# Patient Record
Sex: Male | Born: 2000 | Race: Black or African American | Hispanic: No | Marital: Single | Smoking: Never smoker
Health system: Southern US, Community
[De-identification: ages and names within clinical notes are randomized; demographics above are authoritative.]

## PROBLEM LIST (undated history)

## (undated) DIAGNOSIS — S060X9A Concussion with loss of consciousness of unspecified duration, initial encounter: Secondary | ICD-10-CM

## (undated) HISTORY — DX: Concussion with loss of consciousness of unspecified duration, initial encounter: S06.0X9A

## (undated) HISTORY — PX: NO PAST SURGERIES: SHX2092

---
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
## 2020-05-23 ENCOUNTER — Emergency Department (INDEPENDENT_AMBULATORY_CARE_PROVIDER_SITE_OTHER): Payer: Managed Care, Other (non HMO)

## 2020-05-23 ENCOUNTER — Encounter: Payer: Self-pay | Admitting: Emergency Medicine

## 2020-05-23 ENCOUNTER — Other Ambulatory Visit: Payer: Self-pay

## 2020-05-23 ENCOUNTER — Emergency Department (INDEPENDENT_AMBULATORY_CARE_PROVIDER_SITE_OTHER)
Admission: EM | Admit: 2020-05-23 | Discharge: 2020-05-23 | Disposition: A | Discharge status: Home / Self Care | Payer: Managed Care, Other (non HMO) | Source: Home / Self Care | Attending: Family Medicine | Admitting: Family Medicine

## 2020-05-23 DIAGNOSIS — J069 Acute upper respiratory infection, unspecified: Secondary | ICD-10-CM | POA: Diagnosis not present

## 2020-05-23 DIAGNOSIS — R0602 Shortness of breath: Secondary | ICD-10-CM | POA: Diagnosis not present

## 2020-05-23 DIAGNOSIS — Z20822 Contact with and (suspected) exposure to covid-19: Secondary | ICD-10-CM

## 2020-05-23 DIAGNOSIS — R05 Cough: Secondary | ICD-10-CM | POA: Diagnosis not present

## 2020-05-23 NOTE — ED Provider Notes (Signed)
Michael Young CARE    CSN: [id number] Arrival date & time: 05/23/20  1437      History   Chief Complaint Chief Complaint  Patient presents with  . Fatigue  . Shortness of Breath  . Cough  . Nausea  . Diarrhea    HPI Michael Young is a 19 y.o. male.   Patient complains of onset of URI symptoms about 8 days ago including fatigue and chest congestion.  During the past 4 days he has developed increasing cough and chills with tightness in his anterior chest and sensation of shortness of breath.  He has developed nausea (without vomiting) and diarrhea during the past 3 days.  He denies changes in taste/smell.  He reports that he has never felt this sick before.  His 1 year-old brother has confirmed Michael Young.  The history is provided by the patient.    Past Medical History:  Diagnosis Date  . Concussion with loss of consciousness 10/22/2013    There are no problems to display for this patient.   History reviewed. No pertinent surgical history.     Home Medications    Prior to Admission medications   Not on File    Family History Family History  Problem Relation Age of Onset  . Sudden death Neg Hx   . Hypertension Neg Hx   . Hyperlipidemia Neg Hx   . Heart attack Neg Hx   . Diabetes Neg Hx     Social History Social History   Tobacco Use  . Smoking status: Never Smoker  . Smokeless tobacco: Never Used  Substance Use Topics  . Alcohol use: No    Alcohol/week: 0.0 standard drinks  . Drug use: No     Allergies   Patient has no known allergies.   Review of Systems Review of Systems No sore throat + cough No pleuritic pain, but feels tight in anterior chest No wheezing ? nasal congestion No post-nasal drainage No sinus pain/pressure No itchy/red eyes No earache No hemoptysis + SOB No fever, + chills/sweats + nausea No vomiting No abdominal pain  + diarrhea, resolved No urinary symptoms No skin rash + fatigue + myalgias +  headache Used OTC meds (Tylenol) without relief   Physical Exam Triage Vital Signs ED Triage Vitals  Enc Vitals Group     BP 05/23/20 1526 123/83     Pulse Rate 05/23/20 1526 93     Resp 05/23/20 1526 18     Temp 05/23/20 1526 98.8 F (37.1 C)     Temp Source 05/23/20 1526 Oral     SpO2 05/23/20 1526 100 %     Weight 05/23/20 1527 165 lb (74.8 kg)     Height 05/23/20 1527 5\' 10"  (1.778 m)     Head Circumference --      Peak Flow --      Pain Score 05/23/20 1526 0     Pain Loc --      Pain Edu? --      Excl. in [place]? --    No data found.  Updated Vital Signs BP 123/83 (BP Location: Right Arm)   Pulse 93   Temp 98.8 F (37.1 C) (Oral)   Resp 18   Ht 5\' 10"  (1.778 m)   Wt 74.8 kg   SpO2 100%   BMI 23.68 kg/m   Visual Acuity Right Eye Distance:   Left Eye Distance:   Bilateral Distance:    Right Eye Near:   Left Eye Near:  Bilateral Near:     Physical Exam Nursing notes and Vital Signs reviewed. Appearance:  Patient appears stated age, and in no acute distress Eyes:  Pupils are equal, round, and reactive to light and accomodation.  Extraocular movement is intact.  Conjunctivae are not inflamed  Ears:  Canals normal.  Tympanic membranes normal.  Nose:  Mildly congested turbinates.  No sinus tenderness.  Pharynx:  Normal Neck:  Supple.  Mildly enlarged lateral nodes are present, tender to palpation on the upper left.   Lungs:  Clear to auscultation.  Breath sounds are equal.  Moving air well. Heart:  Regular rate and rhythm without murmurs, rubs, or gallops.  Abdomen:  Nontender without masses or hepatosplenomegaly.  Bowel sounds are present.  No CVA or flank tenderness.  Extremities:  No edema.  Skin:  No rash present.   UC Treatments / Results  Labs (all labs ordered are listed, but only abnormal results are displayed) Labs Reviewed - No data to display  EKG   Radiology DG Chest 2 View  Result Date: 05/23/2020 CLINICAL DATA:  Fatigue.  Cough and  shortness of breath. EXAM: CHEST - 2 VIEW COMPARISON:  None. FINDINGS: The heart size and mediastinal contours are within normal limits. Both lungs are clear. The visualized skeletal structures are unremarkable. IMPRESSION: No active cardiopulmonary disease. Electronically Signed   By: Gerome Sam III M.D   On: 05/23/2020 15:55    Procedures Procedures (including critical care time)  Medications Ordered in UC Medications - No data to display  Initial Impression / Assessment and Plan / UC Course  I have reviewed the triage vital signs and the nursing notes.  Pertinent labs & imaging results that were available during my care of the patient were reviewed by me and considered in my medical decision making (see chart for details).    Benign exam.  Suspect COVID19; send-out test pending. Treat symptomatically for now.    Final Clinical Impressions(s) / UC Diagnoses   Final diagnoses:  Viral URI with cough  Exposure to COVID-19 virus     Discharge Instructions     Take plain guaifenesin (1200mg  extended release tabs such as Mucinex) twice daily, with plenty of water, for cough and congestion.  May add Pseudoephedrine (30mg , one or two every 4 to 6 hours) for sinus congestion.  Get adequate rest.   Try warm salt water gargles for sore throat.  Stop all antihistamines for now, and other non-prescription cough/cold preparations. May take Ibuprofen 200mg , 3 or 4 tabs every 8 hours with food for body aches, headache, etc. May take Delsym Cough Suppressant at bedtime for nighttime cough.   Isolate yourself until COVID-19 test result is available.  If your COVID19 test is positive, then you are infected with the novel coronavirus and could give the virus to others.  Please continue isolation at home for at least 10 days since the start of your symptoms.  Once you complete your 10 day quarantine, you may return to normal activities as long as you've not had a fever for over 24 hours (without taking fever reducing medicine) and your symptoms are improving. Please continue good preventive care measures, including:  frequent hand-washing, avoid touching your face, cover coughs/sneezes, stay out of crowds and keep a 6 foot distance from others.  Go to the nearest hospital emergency room if fever/cough/breathlessness are severe or illness seems like a threat to life.    ED Prescriptions    None        Lattie Haw, MD 05/24/20 1627

## 2020-05-23 NOTE — ED Triage Notes (Signed)
Patient reports onset of fatigue and congestion with developing cough 8 days ago; past 2 days has been nauseated and had some diarrhea (x 3 stools past 24 hours); able to eat solid food and drink fluids. Has not had covid vaccination. Brother was diagnosed with covid today.

## 2020-05-23 NOTE — Discharge Instructions (Addendum)
Take plain guaifenesin (1200mg  extended release tabs such as Mucinex) twice daily, with plenty of water, for cough and congestion.  May add Pseudoephedrine (30mg , one or two every 4 to 6 hours) for sinus congestion.  Get adequate rest.   Try warm salt water gargles for sore throat.  Stop all antihistamines for now, and other non-prescription cough/cold preparations. May take Ibuprofen 200mg , 3 or 4 tabs every 8 hours with food for body aches, headache, etc. May take Delsym Cough Suppressant at bedtime for nighttime cough.   Isolate yourself until COVID-19 test result is available.  If your COVID19 test is positive, then you are infected with the novel coronavirus and could give the virus to others.  Please continue isolation at home for at least 10 days since the start of your symptoms.  Once you complete your 10 day quarantine, you may return to normal activities as long as you've not had a fever for over 24 hours (without taking fever reducing medicine) and your symptoms are improving. Please continue good preventive care measures, including:  frequent hand-washing, avoid touching your face, cover coughs/sneezes, stay out of crowds and keep a 6 foot distance from others.  Go to the nearest hospital emergency room if fever/cough/breathlessness are severe or illness seems like a threat to life.

## 2020-05-25 ENCOUNTER — Telehealth: Payer: Self-pay | Admitting: Emergency Medicine

## 2020-05-25 NOTE — Telephone Encounter (Signed)
Call from mother(Jill) - no COVID results back at this time- 2 identifiers confirmed

## 2020-05-26 ENCOUNTER — Telehealth: Payer: Self-pay

## 2020-05-26 LAB — SARS-COV-2 RNA,(COVID-19) QUALITATIVE NAAT: SARS CoV2 RNA: DETECTED — AB

## 2020-05-26 NOTE — Telephone Encounter (Signed)
Positive COVID results provided to patient and patient's mother. COVID care discussed, verbalized understanding.

## 2020-11-21 ENCOUNTER — Other Ambulatory Visit: Payer: Self-pay

## 2020-11-21 ENCOUNTER — Encounter (HOSPITAL_BASED_OUTPATIENT_CLINIC_OR_DEPARTMENT_OTHER): Payer: Self-pay | Admitting: Orthopaedic Surgery

## 2020-11-22 ENCOUNTER — Other Ambulatory Visit (HOSPITAL_COMMUNITY): Payer: Managed Care, Other (non HMO)

## 2020-11-23 ENCOUNTER — Other Ambulatory Visit (HOSPITAL_COMMUNITY)
Admission: RE | Admit: 2020-11-23 | Discharge: 2020-11-23 | Disposition: A | Discharge status: Home / Self Care | Payer: Managed Care, Other (non HMO) | Source: Ambulatory Visit | Attending: Orthopaedic Surgery | Admitting: Orthopaedic Surgery

## 2020-11-23 DIAGNOSIS — Z01812 Encounter for preprocedural laboratory examination: Secondary | ICD-10-CM | POA: Insufficient documentation

## 2020-11-23 DIAGNOSIS — Z20822 Contact with and (suspected) exposure to covid-19: Secondary | ICD-10-CM | POA: Insufficient documentation

## 2020-11-23 LAB — SARS CORONAVIRUS 2 (TAT 6-24 HRS): SARS Coronavirus 2: NEGATIVE

## 2020-11-23 NOTE — Progress Notes (Signed)

## 2020-11-24 NOTE — H&P (Signed)
PREOPERATIVE H&P  Chief Complaint: LEFT ELBOW TRICEP TENDON RUPTURE  HPI: Michael Young is a 19 y.o. male who is scheduled for, Procedure(s): DISTAL TRICEPS TENDON REPAIR, POSSIBLE TENDON GRAFT.    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  He fell to the ground on an outstretched hand. Once tackled, he had immediate pain in the left distal elbow. He was seen in urgent care where x-rays were consistent with a displaced triceps tendon rupture with a piece of bone pulled off 3-4 centimeters. Family declined surgical interventions during football season.   His symptoms are rated as moderate to severe, and have been worsening.  This is significantly impairing activities of daily living.    Please see clinic note for further details on this patient's care.    He has elected for surgical management.   Past Medical History:  Diagnosis Date  . Concussion with loss of consciousness 10/22/2013   Past Surgical History:  Procedure Laterality Date  . NO PAST SURGERIES     Social History   Socioeconomic History  . Marital status: Single    Spouse name: Not on file  . Number of children: Not on file  . Years of education: Not on file  . Highest education level: Not on file  Occupational History  . Not on file  Tobacco Use  . Smoking status: Never Smoker  . Smokeless tobacco: Never Used  Vaping Use  . Vaping Use: Never used  Substance and Sexual Activity  . Alcohol use: No    Alcohol/week: 0.0 standard drinks  . Drug use: No  . Sexual activity: Never  Other Topics Concern  . Not on file  Social History Narrative  . Not on file   Social Determinants of Health   Financial Resource Strain:   . Difficulty of Paying Living Expenses: Not on file  Food Insecurity:   . Worried About Programme researcher, broadcasting/film/video in the Last Year: Not on file  . Ran Out of Food in the Last Year: Not on file    Transportation Needs:   . Lack of Transportation (Medical): Not on file  . Lack of Transportation (Non-Medical): Not on file  Physical Activity:   . Days of Exercise per Week: Not on file  . Minutes of Exercise per Session: Not on file  Stress:   . Feeling of Stress : Not on file  Social Connections:   . Frequency of Communication with Friends and Family: Not on file  . Frequency of Social Gatherings with Friends and Family: Not on file  . Attends Religious Services: Not on file  . Active Member of Clubs or Organizations: Not on file  . Attends Banker Meetings: Not on file  . Marital Status: Not on file   Family History  Problem Relation Age of Onset  . Sudden death Neg Hx   . Hypertension Neg Hx   . Hyperlipidemia Neg Hx   . Heart attack Neg Hx   . Diabetes Neg Hx    No Known Allergies Prior to Admission medications   Not on File    ROS: All other systems have been reviewed and were otherwise negative with the exception of those mentioned in the HPI and as above.  Physical Exam: General: Alert, no acute distress Cardiovascular: No pedal edema Respiratory: No cyanosis, no use of accessory musculature GI: No organomegaly, abdomen is soft and  non-tender Skin: No lesions in the area of chief complaint Neurologic: Sensation intact distally Psychiatric: Patient is competent for consent with normal mood and affect Lymphatic: No axillary or cervical lymphadenopathy  MUSCULOSKELETAL:  Left elbow: The patient had no active triceps extension.  He has obvious palpation of a defect of his distal triceps.  The triceps tendon appears to only be about an inch or two off the arm.  His distal motor and sensory function is otherwise intact.  He has passive range of motion of the elbow.    Imaging: MRI of his left elbow that has revealed a complete triceps tendon rupture, as well as at least a partial tear of the ulnar collateral ligament with bone contusions laterally,  consistent with a valgus stress.  Assessment: LEFT ELBOW TRICEP TENDON RUPTURE  Plan: Plan for Procedure(s): DISTAL TRICEPS TENDON REPAIR, POSSIBLE TENDON GRAFT  The patient has got a complex presentation with a delayed plan for surgery.  We talked about repair of his distal triceps and possible augmentation with an Achilles allograft.  The family understands that there is a higher risk for stiffness, heterotopic bone formation, and re-tear, all because of the delayed presentation as well as loss of function overall.    The risks benefits and alternatives were discussed with the patient including but not limited to the risks of nonoperative treatment, versus surgical intervention including infection, bleeding, nerve injury,  blood clots, cardiopulmonary complications, morbidity, mortality, among others, and they were willing to proceed.   The patient acknowledged the explanation, agreed to proceed with the plan and consent was signed.   Operative Plan: repair of distal triceps and possible augmentation with an Achilles allograft Discharge Medications: Standard DVT Prophylaxis: None Physical Therapy: Outpatient PT Special Discharge needs: Splint. Sling.    Vernetta Honey, PA-C  11/24/2020 1:45 PM

## 2020-11-25 ENCOUNTER — Ambulatory Visit (HOSPITAL_BASED_OUTPATIENT_CLINIC_OR_DEPARTMENT_OTHER): Payer: Managed Care, Other (non HMO) | Admitting: Anesthesiology

## 2020-11-25 ENCOUNTER — Other Ambulatory Visit: Payer: Self-pay

## 2020-11-25 ENCOUNTER — Encounter (HOSPITAL_BASED_OUTPATIENT_CLINIC_OR_DEPARTMENT_OTHER): Payer: Self-pay | Admitting: Orthopaedic Surgery

## 2020-11-25 ENCOUNTER — Encounter (HOSPITAL_BASED_OUTPATIENT_CLINIC_OR_DEPARTMENT_OTHER)
Admission: RE | Disposition: A | Discharge status: Home / Self Care | Payer: Self-pay | Source: Home / Self Care | Attending: Orthopaedic Surgery

## 2020-11-25 ENCOUNTER — Ambulatory Visit (HOSPITAL_BASED_OUTPATIENT_CLINIC_OR_DEPARTMENT_OTHER)
Admission: RE | Admit: 2020-11-25 | Discharge: 2020-11-25 | Disposition: A | Discharge status: Home / Self Care | Payer: Managed Care, Other (non HMO) | Attending: Orthopaedic Surgery | Admitting: Orthopaedic Surgery

## 2020-11-25 DIAGNOSIS — S46312A Strain of muscle, fascia and tendon of triceps, left arm, initial encounter: Secondary | ICD-10-CM | POA: Diagnosis not present

## 2020-11-25 DIAGNOSIS — W03XXXA Other fall on same level due to collision with another person, initial encounter: Secondary | ICD-10-CM | POA: Insufficient documentation

## 2020-11-25 DIAGNOSIS — S46319A Strain of muscle, fascia and tendon of triceps, unspecified arm, initial encounter: Secondary | ICD-10-CM | POA: Diagnosis present

## 2020-11-25 HISTORY — PX: DISTAL BICEPS TENDON REPAIR: SHX1461

## 2020-11-25 SURGERY — REPAIR, TENDON, BICEPS, DISTAL
Anesthesia: Regional | Site: Arm Upper | Laterality: Left

## 2020-11-25 MED ORDER — EPHEDRINE SULFATE 50 MG/ML IJ SOLN
INTRAMUSCULAR | Status: DC | PRN
  Administered 2020-11-25: 10 mg via INTRAVENOUS

## 2020-11-25 MED ORDER — MIDAZOLAM HCL 2 MG/2ML IJ SOLN
INTRAMUSCULAR | Status: AC
  Filled 2020-11-25: qty 2

## 2020-11-25 MED ORDER — ONDANSETRON HCL 4 MG PO TABS: 4.0000 mg | ORAL_TABLET | Freq: Three times a day (TID) | ORAL | 1 refills | Status: AC | PRN

## 2020-11-25 MED ORDER — OXYCODONE HCL 5 MG PO TABS: 5.0000 mg | ORAL_TABLET | Freq: Once | ORAL | Status: DC | PRN

## 2020-11-25 MED ORDER — FENTANYL CITRATE (PF) 100 MCG/2ML IJ SOLN: 25.0000 ug | INTRAMUSCULAR | Status: DC | PRN

## 2020-11-25 MED ORDER — PROMETHAZINE HCL 25 MG/ML IJ SOLN: 6.2500 mg | INTRAMUSCULAR | Status: DC | PRN

## 2020-11-25 MED ORDER — PROPOFOL 10 MG/ML IV BOLUS
INTRAVENOUS | Status: DC | PRN
  Administered 2020-11-25: 200 mg via INTRAVENOUS

## 2020-11-25 MED ORDER — VANCOMYCIN HCL 1000 MG IV SOLR
INTRAVENOUS | Status: DC | PRN
  Administered 2020-11-25: 1000 mg via TOPICAL

## 2020-11-25 MED ORDER — PHENYLEPHRINE HCL (PRESSORS) 10 MG/ML IV SOLN
INTRAVENOUS | Status: DC | PRN
  Administered 2020-11-25: 80 ug via INTRAVENOUS

## 2020-11-25 MED ORDER — FENTANYL CITRATE (PF) 100 MCG/2ML IJ SOLN
100.0000 ug | Freq: Once | INTRAMUSCULAR | Status: AC
  Administered 2020-11-25: 100 ug via INTRAVENOUS

## 2020-11-25 MED ORDER — CEFAZOLIN SODIUM-DEXTROSE 2-4 GM/100ML-% IV SOLN
INTRAVENOUS | Status: AC
  Filled 2020-11-25: qty 100

## 2020-11-25 MED ORDER — LIDOCAINE 2% (20 MG/ML) 5 ML SYRINGE
INTRAMUSCULAR | Status: DC | PRN
  Administered 2020-11-25: 30 mg via INTRAVENOUS

## 2020-11-25 MED ORDER — OXYCODONE HCL 5 MG PO TABS
ORAL_TABLET | ORAL | 0 refills | Status: AC
Start: 2020-11-25 — End: 2020-11-30

## 2020-11-25 MED ORDER — PROPOFOL 500 MG/50ML IV EMUL
INTRAVENOUS | Status: DC | PRN
  Administered 2020-11-25: 25 ug/kg/min via INTRAVENOUS

## 2020-11-25 MED ORDER — CEFAZOLIN SODIUM-DEXTROSE 2-4 GM/100ML-% IV SOLN
2.0000 g | INTRAVENOUS | Status: AC
  Administered 2020-11-25: 2 g via INTRAVENOUS

## 2020-11-25 MED ORDER — LACTATED RINGERS IV SOLN: INTRAVENOUS | Status: DC

## 2020-11-25 MED ORDER — DEXAMETHASONE SODIUM PHOSPHATE 10 MG/ML IJ SOLN
INTRAMUSCULAR | Status: DC | PRN
  Administered 2020-11-25: 4 mg via INTRAVENOUS

## 2020-11-25 MED ORDER — IBUPROFEN 800 MG PO TABS: 800.0000 mg | ORAL_TABLET | Freq: Three times a day (TID) | ORAL | 0 refills | Status: AC

## 2020-11-25 MED ORDER — ACETAMINOPHEN 500 MG PO TABS: 1000.0000 mg | ORAL_TABLET | Freq: Three times a day (TID) | ORAL | 0 refills | Status: AC

## 2020-11-25 MED ORDER — ONDANSETRON HCL 4 MG/2ML IJ SOLN
INTRAMUSCULAR | Status: DC | PRN
  Administered 2020-11-25: 4 mg via INTRAVENOUS

## 2020-11-25 MED ORDER — MIDAZOLAM HCL 2 MG/2ML IJ SOLN
2.0000 mg | Freq: Once | INTRAMUSCULAR | Status: AC
  Administered 2020-11-25: 2 mg via INTRAVENOUS

## 2020-11-25 MED ORDER — ROPIVACAINE HCL 5 MG/ML IJ SOLN
INTRAMUSCULAR | Status: DC | PRN
  Administered 2020-11-25: 30 mL via PERINEURAL

## 2020-11-25 MED ORDER — FENTANYL CITRATE (PF) 100 MCG/2ML IJ SOLN
INTRAMUSCULAR | Status: AC
  Filled 2020-11-25: qty 2

## 2020-11-25 MED ORDER — OXYCODONE HCL 5 MG/5ML PO SOLN: 5.0000 mg | Freq: Once | ORAL | Status: DC | PRN

## 2020-11-25 SURGICAL SUPPLY — 86 items
APL PRP STRL LF DISP 70% ISPRP (MISCELLANEOUS) ×1
APL SKNCLS STERI-STRIP NONHPOA (GAUZE/BANDAGES/DRESSINGS) ×1
BENZOIN TINCTURE PRP APPL 2/3 (GAUZE/BANDAGES/DRESSINGS) ×3 IMPLANT
BLADE HEX COATED 2.75 (ELECTRODE) IMPLANT
BLADE SURG 10 STRL SS (BLADE) ×3 IMPLANT
BLADE SURG 15 STRL LF DISP TIS (BLADE) ×1 IMPLANT
BLADE SURG 15 STRL SS (BLADE) ×3
BNDG CMPR 9X4 STRL LF SNTH (GAUZE/BANDAGES/DRESSINGS) ×1
BNDG ELASTIC 4X5.8 VLCR STR LF (GAUZE/BANDAGES/DRESSINGS) ×6 IMPLANT
BNDG ESMARK 4X9 LF (GAUZE/BANDAGES/DRESSINGS) ×3 IMPLANT
CHLORAPREP W/TINT 26 (MISCELLANEOUS) ×3 IMPLANT
CLOSURE STERI-STRIP 1/2X4 (GAUZE/BANDAGES/DRESSINGS) ×1
CLSR STERI-STRIP ANTIMIC 1/2X4 (GAUZE/BANDAGES/DRESSINGS) ×2 IMPLANT
CORD BIPOLAR FORCEPS 12FT (ELECTRODE) ×3 IMPLANT
COVER WAND RF STERILE (DRAPES) IMPLANT
CUFF TOURN SGL QUICK 18X3 (MISCELLANEOUS) ×3 IMPLANT
CUFF TOURN SGL QUICK 24 (TOURNIQUET CUFF) ×3
CUFF TRNQT CYL 24X4X16.5-23 (TOURNIQUET CUFF) ×1 IMPLANT
DECANTER SPIKE VIAL GLASS SM (MISCELLANEOUS) ×3 IMPLANT
DRAPE EXTREMITY T 121X128X90 (DISPOSABLE) ×3 IMPLANT
DRAPE INCISE IOBAN 66X45 STRL (DRAPES) ×3 IMPLANT
DRAPE OEC MINIVIEW 54X84 (DRAPES) ×3 IMPLANT
DRAPE U-SHAPE 47X51 STRL (DRAPES) ×3 IMPLANT
ELECT REM PT RETURN 9FT ADLT (ELECTROSURGICAL) ×3
ELECTRODE REM PT RTRN 9FT ADLT (ELECTROSURGICAL) ×1 IMPLANT
EXT HOSE W/PLC CONNECTION (MISCELLANEOUS) ×3
EXTENSION HOSE W/PLC CONNECTON (MISCELLANEOUS) ×1 IMPLANT
GAUZE SPONGE 4X4 12PLY STRL (GAUZE/BANDAGES/DRESSINGS) ×3 IMPLANT
GAUZE XEROFORM 1X8 LF (GAUZE/BANDAGES/DRESSINGS) ×3 IMPLANT
GLOVE BIO SURGEON STRL SZ 6.5 (GLOVE) ×2 IMPLANT
GLOVE BIO SURGEON STRL SZ8 (GLOVE) ×3 IMPLANT
GLOVE BIO SURGEONS STRL SZ 6.5 (GLOVE) ×1
GLOVE BIOGEL PI IND STRL 6.5 (GLOVE) ×1 IMPLANT
GLOVE BIOGEL PI IND STRL 8 (GLOVE) ×1 IMPLANT
GLOVE BIOGEL PI INDICATOR 6.5 (GLOVE) ×2
GLOVE BIOGEL PI INDICATOR 8 (GLOVE) ×2
GLOVE ECLIPSE 8.0 STRL XLNG CF (GLOVE) ×6 IMPLANT
GOWN STRL REUS W/ TWL LRG LVL3 (GOWN DISPOSABLE) ×2 IMPLANT
GOWN STRL REUS W/TWL LRG LVL3 (GOWN DISPOSABLE) ×6
GOWN STRL REUS W/TWL XL LVL3 (GOWN DISPOSABLE) ×3 IMPLANT
IMP SYS 2ND FIX PEEK 4.75X19.1 (Miscellaneous) ×3 IMPLANT
IMPL SYS 2ND FX PEEK 4.75X19.1 (Miscellaneous) ×1 IMPLANT
NEEDLE KEITH (NEEDLE) ×3 IMPLANT
NEEDLE TAPERED W/ NITINOL LOOP (MISCELLANEOUS) ×3 IMPLANT
NS IRRIG 1000ML POUR BTL (IV SOLUTION) ×3 IMPLANT
PACK ARTHROSCOPY DSU (CUSTOM PROCEDURE TRAY) ×3 IMPLANT
PACK BASIN DAY SURGERY FS (CUSTOM PROCEDURE TRAY) ×3 IMPLANT
PAD CAST 4YDX4 CTTN HI CHSV (CAST SUPPLIES) ×1 IMPLANT
PAD CAST CTTN 4X4 STRL (SOFTGOODS) ×3 IMPLANT
PAD COLD SHLDR WRAP-ON (PAD) IMPLANT
PADDING CAST COTTON 4X4 STRL (CAST SUPPLIES) ×3
PADDING CAST COTTON 4X4 STRL (SOFTGOODS) ×9
PENCIL SMOKE EVACUATOR (MISCELLANEOUS) ×3 IMPLANT
RETRIEVER SUT HEWSON (MISCELLANEOUS) ×3 IMPLANT
SHEET MEDIUM DRAPE 40X70 STRL (DRAPES) ×3 IMPLANT
SLEEVE SCD COMPRESS KNEE MED (MISCELLANEOUS) ×3 IMPLANT
SLING ARM FOAM STRAP LRG (SOFTGOODS) IMPLANT
SPLINT FAST PLASTER 5X30 (CAST SUPPLIES) ×20
SPLINT PLASTER CAST FAST 5X30 (CAST SUPPLIES) ×10 IMPLANT
SPONGE LAP 18X18 RF (DISPOSABLE) ×3 IMPLANT
STAPLER VISISTAT 35W (STAPLE) IMPLANT
SUCTION FRAZIER HANDLE 10FR (MISCELLANEOUS) ×2
SUCTION TUBE FRAZIER 10FR DISP (MISCELLANEOUS) ×1 IMPLANT
SUT ETHILON 2 0 FS 18 (SUTURE) ×9 IMPLANT
SUT FIBERWIRE #2 38 REV NDL BL (SUTURE) ×6
SUT FIBERWIRE #2 38 T-5 BLUE (SUTURE)
SUT FIBERWIRE #5 38 CONV NDL (SUTURE)
SUT MNCRL AB 4-0 PS2 18 (SUTURE) ×3 IMPLANT
SUT VIC AB 0 CT1 27 (SUTURE) ×9
SUT VIC AB 0 CT1 27XBRD ANBCTR (SUTURE) ×3 IMPLANT
SUT VIC AB 1 CT1 27 (SUTURE)
SUT VIC AB 1 CT1 27XBRD ANBCTR (SUTURE) IMPLANT
SUT VIC AB 2-0 CT1 27 (SUTURE)
SUT VIC AB 2-0 CT1 TAPERPNT 27 (SUTURE) IMPLANT
SUT VIC AB 2-0 SH 27 (SUTURE) ×3
SUT VIC AB 2-0 SH 27XBRD (SUTURE) ×1 IMPLANT
SUT VIC AB 3-0 SH 27 (SUTURE) ×3
SUT VIC AB 3-0 SH 27X BRD (SUTURE) ×1 IMPLANT
SUTURE FIBERWR #2 38 T-5 BLUE (SUTURE) IMPLANT
SUTURE FIBERWR #5 38 CONV NDL (SUTURE) IMPLANT
SUTURE FIBERWR#2 38 REV NDL BL (SUTURE) ×2 IMPLANT
SUTURE TAPE TIGERLINK 1.3MM BL (SUTURE) ×2 IMPLANT
SUTURETAPE TIGERLINK 1.3MM BL (SUTURE) ×6
SYR BULB EAR ULCER 3OZ GRN STR (SYRINGE) ×3 IMPLANT
TOWEL GREEN STERILE FF (TOWEL DISPOSABLE) ×3 IMPLANT
YANKAUER SUCT BULB TIP NO VENT (SUCTIONS) ×3 IMPLANT

## 2020-11-25 NOTE — Interval H&P Note (Signed)
History and Physical Interval Note:  11/25/2020 2:07 PM  Michael Young  has presented today for surgery, with the diagnosis of LEFT ELBOW TRICEP TENDON RUPTURE.  The various methods of treatment have been discussed with the patient and family. After consideration of risks, benefits and other options for treatment, the patient has consented to  Procedure(s): DISTAL TRICEPS TENDON REPAIR, POSSIBLE TENDON GRAFT (Left) as a surgical intervention.  The patient's history has been reviewed, patient examined, no change in status, stable for surgery.  I have reviewed the patient's chart and labs.  Questions were answered to the patient's satisfaction.     Bjorn Pippin

## 2020-11-25 NOTE — Anesthesia Procedure Notes (Signed)
Anesthesia Regional Block: Supraclavicular block   Pre-Anesthetic Checklist: ,, timeout performed, Correct Patient, Correct Site, Correct Laterality, Correct Procedure, Correct Position, site marked, Risks and benefits discussed,  Surgical consent,  Pre-op evaluation,  At surgeon's request and post-op pain management  Laterality: Left  Prep: chloraprep       Needles:  Injection technique: Single-shot  Needle Type: Echogenic Stimulator Needle     Needle Length: 10cm  Needle Gauge: 20     Additional Needles:   Procedures:,,,, ultrasound used (permanent image in chart),,,,  Narrative:  Start time: 11/25/2020 1:30 PM End time: 11/25/2020 1:35 PM Injection made incrementally with aspirations every 5 mL.  Performed by: Personally  Anesthesiologist: Mellody Dance, MD  Additional Notes: Functioning IV was confirmed and monitors applied. . Sterile prep and drape,hand hygiene and sterile gloves were used.Ultrasound guidance: relevant anatomy identified, needle position confirmed, local anesthetic spread visualized around nerve(s)., vascular puncture avoided.  Image printed for medical record.  Negative aspiration and negative test dose prior to incremental administration of local anesthetic. The patient tolerated the procedure well.

## 2020-11-25 NOTE — Anesthesia Postprocedure Evaluation (Signed)
Anesthesia Post Note  Patient: Michael Young  Procedure(s) Performed: DISTAL TRICEPS TENDON REPAIR (Left Arm Upper)     Patient location during evaluation: PACU Anesthesia Type: Regional Level of consciousness: sedated Pain management: pain level controlled Vital Signs Assessment: post-procedure vital signs reviewed and stable Respiratory status: spontaneous breathing and respiratory function stable Cardiovascular status: stable Postop Assessment: no apparent nausea or vomiting Anesthetic complications: no   No complications documented.  Last Vitals:  Vitals:   11/25/20 1645 11/25/20 1656  BP: 128/73   Pulse: 92 97  Resp: (!) 23 (!) 21  Temp:    SpO2: 100% 99%    Last Pain:  Vitals:   11/25/20 1645  TempSrc:   PainSc: Asleep                 Mellody Dance

## 2020-11-25 NOTE — Discharge Instructions (Signed)
Ramond Marrow MD, MPH  Alfonse Alpers, PA-C  [facility]  [address], [address]  [phone] (tel)   [phone] (fax)    POST-OPERATIVE INSTRUCTIONS    WOUND CARE  - Please keep splint clean dry and intact until followup.  - You may shower on Post-Op Day #3.  - You must keep splint DRY during this process and may find that a plastic bag taped around the extremity or alternatively a towel based bath may be a better option.   - If you get your splint wet or if it is damaged please contact our clinic.  - DO NOT REMOVE YOUR SPLINT!  EXERCISES  - Due to your splint being in place you will not be able to bear weight through your extremity.   - YOUR ARM MUST STAY STRAIGHT AT ALL TIMES - DO NOT REMOVE YOUR SPLINT! - Please continue to work on range of motion of your fingers and stretch these multiple times a day to prevent stiffness.  - Please continue to ambulate and do not stay sitting or lying for too long. Perform foot and wrist pumps to assist in circulation.   FOLLOW-UP  If you develop a Fever (>101.5), Redness or Drainage from the surgical incision site, please call our office to arrange for an evaluation.  Please call the office to schedule a follow-up appointment for your incision check if you do not already have one, 7-10 days post-operatively.   REGIONAL ANESTHESIA (NERVE BLOCKS)  The anesthesia team may have performed a nerve block for you if safe in the setting of your care. This is a great tool used to minimize pain. Typically the block may start wearing off overnight but the long acting medicine may last for 3-4 days. The nerve block wearing off can be a challenging period but please utilize your as needed pain medications to try and manage this period.    POST-OP MEDICATIONS- Multimodal approach to pain control   In general your pain will be controlled with a combination of substances. Prescriptions unless otherwise discussed are  electronically sent to your pharmacy. This is a carefully made plan we use to minimize narcotic use.    - Ibuprofen - Anti-inflammatory medication taken on a scheduled basis  - Acetaminophen - Non-narcotic pain medicine taken on a scheduled basis  - Oxycodone - This is a strong narcotic, to be used only on an "as needed" basis for pain.  - Zofran - take as needed for nausea   HELPFUL INFORMATION    If you had a block, it will wear off between 8-24 hrs postop typically. This is period when your pain may go from nearly zero to the pain you would have had postop without the block. This is an abrupt transition but nothing dangerous is happening. You may take an extra dose of narcotic when this happens.    You may be more comfortable sleeping in a semi-seated position the first few nights following surgery. Keep a pillow propped under the elbow and forearm for comfort. If you have a recliner type of chair it might be beneficial. If not that is fine too, but it would be helpful to sleep propped up with pillows behind your operated shoulder as well under your elbow and forearm. This will reduce pulling on the suture lines.    When dressing, put your operative arm in the sleeve first. When getting undressed, take your operative arm out last. Loose fitting, button-down shirts are recommended. Often in  the first days after surgery you may be more comfortable keeping your operative arm under your shirt and not through the sleeve.    You may return to work/school in the next couple of days when you feel up to it. Desk work and typing in the splint is fine.    We suggest you use the pain medication the first night prior to going to bed, in order to ease any pain when the anesthesia wears off. You should avoid taking pain medications on an empty stomach as it will make you nauseous.    You should wean off your narcotic medicines as soon as you are able. Most patients will be off or using  minimal narcotics before their first postop appointment.    Do not drink alcoholic beverages or take illicit drugs when taking pain medications.    It is against the law to drive while taking narcotics. In some states it is against the law to drive while your arm is in a splint.    Pain medication may make you constipated. Below are a few solutions to try in this order:  - Decrease the amount of pain medication if you aren't having pain.  - Drink lots of decaffeinated fluids.  - Drink prune juice and/or each dried prunes    If the first 3 don't work start with additional solutions  - Take Colace - an over-the-counter stool softener  - Take Senokot - an over-the-counter laxative  - Take Miralax - a stronger over-the-counter laxative  Post Anesthesia Home Care Instructions  Activity: Get plenty of rest for the remainder of the day. A responsible individual must stay with you for 24 hours following the procedure.  For the next 24 hours, DO NOT: -Drive a car -Advertising copywriter -Drink alcoholic beverages -Take any medication unless instructed by your physician -Make any legal decisions or sign important papers.  Meals: Start with liquid foods such as gelatin or soup. Progress to regular foods as tolerated. Avoid greasy, spicy, heavy foods. If nausea and/or vomiting occur, drink only clear liquids until the nausea and/or vomiting subsides. Call your physician if vomiting continues.  Special Instructions/Symptoms: Your throat may feel dry or sore from the anesthesia or the breathing tube placed in your throat during surgery. If this causes discomfort, gargle with warm salt water. The discomfort should disappear within 24 hours.  If you had a scopolamine patch placed behind your ear for the management of post- operative nausea and/or vomiting:  1. The medication in the patch is effective for 72 hours, after which it should be removed.  Wrap patch in a tissue and discard in the trash.  Wash hands thoroughly with soap and water. 2. You may remove the patch earlier than 72 hours if you experience unpleasant side effects which may include dry mouth, dizziness or visual disturbances. 3. Avoid touching the patch. Wash your hands with soap and water after contact with the patch.    Regional Anesthesia Blocks  1. Numbness or the inability to move the "blocked" extremity may last from 3-48 hours after placement. The length of time depends on the medication injected and your individual response to the medication. If the numbness is not going away after 48 hours, call your surgeon.  2. The extremity that is blocked will need to be protected until the numbness is gone and the  Strength has returned. Because you cannot feel it, you will need to take extra care to avoid injury. Because it may be weak,  you may have difficulty moving it or using it. You may not know what position it is in without looking at it while the block is in effect.  3. For blocks in the legs and feet, returning to weight bearing and walking needs to be done carefully. You will need to wait until the numbness is entirely gone and the strength has returned. You should be able to move your leg and foot normally before you try and bear weight or walk. You will need someone to be with you when you first try to ensure you do not fall and possibly risk injury.  4. Bruising and tenderness at the needle site are common side effects and will resolve in a few days.  5. Persistent numbness or new problems with movement should be communicated to the surgeon or the [facility] [phone] [facility] [phone]).

## 2020-11-25 NOTE — Transfer of Care (Signed)
Immediate Anesthesia Transfer of Care Note  Patient: Michael Young  Procedure(s) Performed: DISTAL TRICEPS TENDON REPAIR (Left Arm Upper)  Patient Location: PACU  Anesthesia Type:GA combined with regional for post-op pain  Level of Consciousness: drowsy and patient cooperative  Airway & Oxygen Therapy: Patient Spontanous Breathing and Patient connected to face mask oxygen  Post-op Assessment: Report given to RN and Post -op Vital signs reviewed and stable  Post vital signs: Reviewed and stable  Last Vitals:  Vitals Value Taken Time  BP 109/51 11/25/20 1620  Temp 36.6 C 11/25/20 1620  Pulse 78 11/25/20 1624  Resp 25 11/25/20 1624  SpO2 100 % 11/25/20 1624  Vitals shown include unvalidated device data.  Last Pain:  Vitals:   11/25/20 1210  TempSrc: Oral  PainSc: 0-No pain      Patients Stated Pain Goal: 6 (11/25/20 1210)  Complications: No complications documented.

## 2020-11-25 NOTE — Progress Notes (Signed)
Assisted Dr. Bass with left, ultrasound guided, supraclavicular block. Side rails up, monitors on throughout procedure. See vital signs in flow sheet. Tolerated Procedure well. 

## 2020-11-25 NOTE — Anesthesia Procedure Notes (Signed)
Procedure Name: LMA Insertion Date/Time: 11/25/2020 2:38 PM Performed by: Jorge Amparo, Jewel Baize, CRNA Pre-anesthesia Checklist: Patient identified, Emergency Drugs available, Suction available and Patient being monitored Patient Re-evaluated:Patient Re-evaluated prior to induction Oxygen Delivery Method: Circle System Utilized Preoxygenation: Pre-oxygenation with 100% oxygen Induction Type: IV induction Ventilation: Mask ventilation without difficulty LMA: LMA inserted LMA Size: 4.0 Number of attempts: 1 Airway Equipment and Method: bite block Placement Confirmation: positive ETCO2 Tube secured with: Tape Dental Injury: Teeth and Oropharynx as per pre-operative assessment

## 2020-11-25 NOTE — Anesthesia Preprocedure Evaluation (Addendum)
Anesthesia Evaluation  Patient identified by MRN, date of birth, ID band Patient awake    Reviewed: Allergy & Precautions, NPO status , Patient's Chart, lab work & pertinent test results  Airway Mallampati: II  TM Distance: >3 FB Neck ROM: Full    Dental no notable dental hx.    Pulmonary neg pulmonary ROS,    Pulmonary exam normal breath sounds clear to auscultation       Cardiovascular Exercise Tolerance: Good negative cardio ROS Normal cardiovascular exam Rhythm:Regular Rate:Normal     Neuro/Psych negative neurological ROS  negative psych ROS   GI/Hepatic negative GI ROS, Neg liver ROS,   Endo/Other  negative endocrine ROS  Renal/GU negative Renal ROS     Musculoskeletal negative musculoskeletal ROS (+)   Abdominal Normal abdominal exam  (+)   Peds negative pediatric ROS (+)  Hematology negative hematology ROS (+)   Anesthesia Other Findings Triceps tendon  Reproductive/Obstetrics negative OB ROS                             Anesthesia Physical Anesthesia Plan  ASA: I  Anesthesia Plan: General and Regional   Post-op Pain Management: GA combined w/ Regional for post-op pain   Induction: Intravenous  PONV Risk Score and Plan: 2  Airway Management Planned: LMA and Oral ETT  Additional Equipment:   Intra-op Plan:   Post-operative Plan: Extubation in OR  Informed Consent: I have reviewed the patients History and Physical, chart, labs and discussed the procedure including the risks, benefits and alternatives for the proposed anesthesia with the patient or authorized representative who has indicated his/her understanding and acceptance.     Dental advisory given  Plan Discussed with: CRNA  Anesthesia Plan Comments: (Supraclavicular block for postop pain control as requested by surgeon Everardo Pacific. GA/LMA vs. GETA depending on patient positioning. )       Anesthesia Quick  Evaluation

## 2020-11-26 NOTE — Op Note (Signed)
Orthopaedic Surgery Operative Note (CSN: [id number])  Michael Young  2001/12/01 Date of Surgery: 11/25/2020   Diagnoses:  LEFT ELBOW TRICEP TENDON RUPTURE, chronic  Procedure: Left distal triceps repair, chronic without graft Left ulnar nerve neurolysis   Operative Finding Successful completion of the planned procedure.  Patient was stable to about 30 degrees of flexion however at his young age we felt that an allograft was really not in his best interest.  There was still reasonable quality tendon that was retracted.  Is retracted about 8 cm from its native location and there was a coronal plane split in the tendon itself.  We asked the tendon together and had good apposition to bone but he has a high risk for stiffness.  The ulnar nerve was buried in scar tissue and carefully neurolysed to avoid damage and to avoid ulnar nerve paresthesias as his newly approximated triceps tendon may have pulled through the scar on the nerve itself.  We left it in its place.  This was extremely difficult case and we worry about the outcome.  We extensively documented and discussed with the family that we had recommended surgery months ago at the initial time of injury however the family and patient were adamant that they would not have surgery until after football season has been completed.  We made the patient obtain a third opinion as well however as an adult patient he still wanted to proceed AGAINST MEDICAL ADVICE.  I worry that he is at significant risk for rerupture and complication compared to his possibilities of these things early in the post injury phase.  That said the repair was robust and we are overall relatively happy with the construct.  Post-operative plan: The patient will be NWB in splint for a week in extension, next week locked straight in a brace in extension then can start distal triceps protocol.  The patient will be nonweightbearing and discharged as above..  DVT prophylaxis not indicated in  this ambulatory upper extremity patient without significant risk factors.   Pain control with PRN pain medication preferring oral medicines.  Follow up plan will be scheduled in approximately 7 days for incision check and XR.  Post-Op Diagnosis: Same Surgeons:Primary: Bjorn Pippin, MD Assistants:Caroline McBane PA-C Location: [facility] OR ROOM 8 Anesthesia: General with regional anesthesia Antibiotics: Ancef 2 g with local vancomycin powder 1 g at the surgical site Tourniquet time:  Total Tourniquet Time Documented: Upper Arm (Left) - 69 minutes Total: Upper Arm (Left) - 69 minutes  Estimated Blood Loss: Minimal Complications: None Specimens: None Implants: Implant Name Type Inv. Item Serial No. Manufacturer Lot No. LRB No. Used Action  IMP SYS 2ND FIX PEEK 4.75X19.1 - [id number] Miscellaneous IMP SYS 2ND FIX PEEK [id number]  [employer] [id number] Left 1 Implanted    Indications for Surgery:   Michael Young is a 19 y.o. male with distal triceps injury in August and a complex history that is well-documented in my findings above as well as in my medical documentation from the clinic.  Despite initially going AGAINST MEDICAL ADVICE and management he eventually requested surgical management with a triceps repair.  Benefits and risks of operative and nonoperative management were discussed prior to surgery with patient/guardian(s) and informed consent form was completed.  Specific risks including infection, need for additional surgery, rerupture, need for allograft, loss of function, stiffness, heterotopic bone amongst others   Procedure:   The patient was identified properly. Informed consent was obtained and the surgical site was marked. The  patient was taken up to suite where general anesthesia was induced.  The patient was positioned lateral on a beanbag with arm over a sure foot positioner.  The left elbow was prepped and draped in the usual sterile fashion.  Timeout was performed before the  beginning of the case.  Tourniquet was used for the above duration.  We began with a posterior longitudinal approach to the elbow.  Went to skin sharply achieving hemostasis we progressed.  We made full-thickness skin flaps medially and laterally and were careful to avoid deep dissection medially to avoid damage to the ulnar nerve.  Branch of the medial antebrachial cutaneous nerve were protected.  At this point we exposed the distal triceps rupture which about 8 cm retracted.  We turned our attention first to the ulnar nerve.  The ulnar nerve was found proximal to the zone of injury and it was noted to be buried in scar tissue.  As we manipulated the triceps the ulnar nerve was clearly being placed under tension.  Based on this we felt that ulnar nerve neurolysis was in the patient's best interest.  We started from proximal proceeded distal and were able to carefully and accurately released the ulnar nerve from it surrounding scar tissue.  We did not perform an ulnar nerve transposition as this did not feel indicated in this patient.  We cleared and release the nerve down to the typical portion past the flexor carpi ulnaris taking care to protect the branch to the flexor carpi ulnaris.  We did not sacrificed the branch to the joint.  At that point once the nerve was fully released we protected with blunt retractors the remainder of the case.  We went back and identified the distal triceps rupture.  We cleared the olecranon of all overlying soft tissue and prepared it for healing using a rondure as well as a curette.  Once this was performed we took stock and the tendon itself.  It was able to be mobilized after extensive dissection and release of scar.  There were 2 distinct limbs of the tendon with a coronal split in the tendon itself.  We were careful to pull both of these under similar tension and stitched these together before proceeding with the rest of the repair.  We used two #2 FiberWire  sutures in ran them up and down the tendon in a modified Krakw fashion.  We had good purchase of the tendon itself.  Once that was performed we used a speed bridge type technique and bone tunnels to perform a double row repair shuttling sutures performing a box and X configuration.  We had great apposition of the tendon to bone for healing.  The elbow was placed in full extension during this portion as the patient had relatively significant amounts of retraction.  At that point we were able to hold our repair together with a 4.75 mm swivel lock anchor, peek, that was placed after drilling and tapping in the proximal ulna.  We took care to avoid the joint for all of these tunnels.  Once this was performed we had a great stable repair to at least 30 degrees of flexion that we were careful to not take it passed out as were worried about stressing our construct.  We then irrigated copiously and placed interrupted figure-of-eight Vicryl sutures to reinforce the repair to the periosteum and any soft tissue around.  Were careful to protect the ulnar nerve noted it was intact at the end of the  case.  We irrigated the wound copiously before placing local antibiotic as listed above.  We closed the incision in a multilayer fashion with absorbable suture.  Sterile dressing was placed.  A splint was placed in near full extension.  Patient was awoken taken to PACU in stable condition.  Alfonse Alpers, PA-C, present and scrubbed throughout the case, critical for completion in a timely fashion, and for retraction, instrumentation, closure.

## 2020-11-28 ENCOUNTER — Encounter (HOSPITAL_BASED_OUTPATIENT_CLINIC_OR_DEPARTMENT_OTHER): Payer: Self-pay | Admitting: Orthopaedic Surgery

## 2021-02-18 IMAGING — DX DG CHEST 2V
2 series · 2 of 2 positions shown · non-contrast
Comparison: None.

CLINICAL DATA: Fatigue.  Cough and shortness of breath.

EXAM:
CHEST - 2 VIEW

[chest pa]
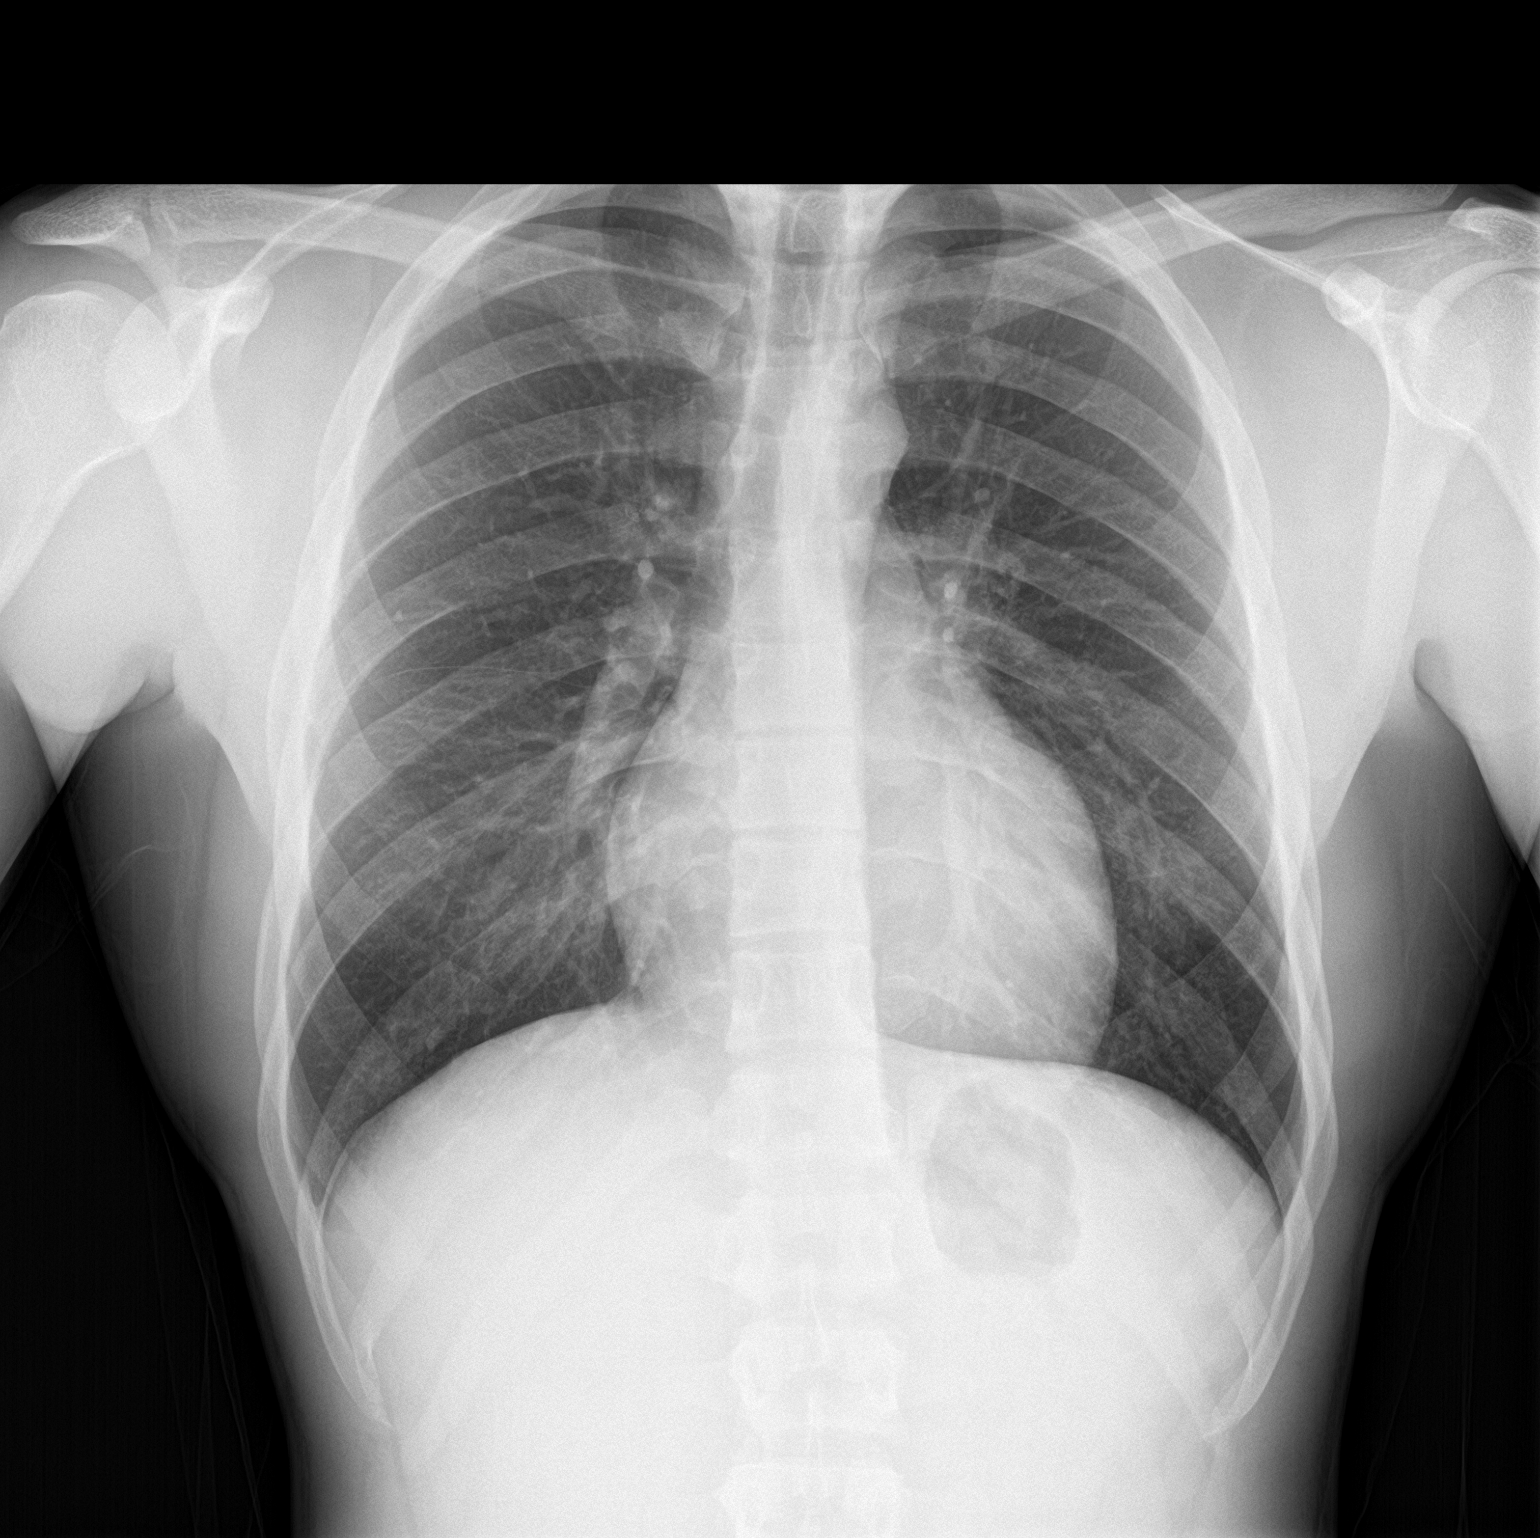

[chest lat]
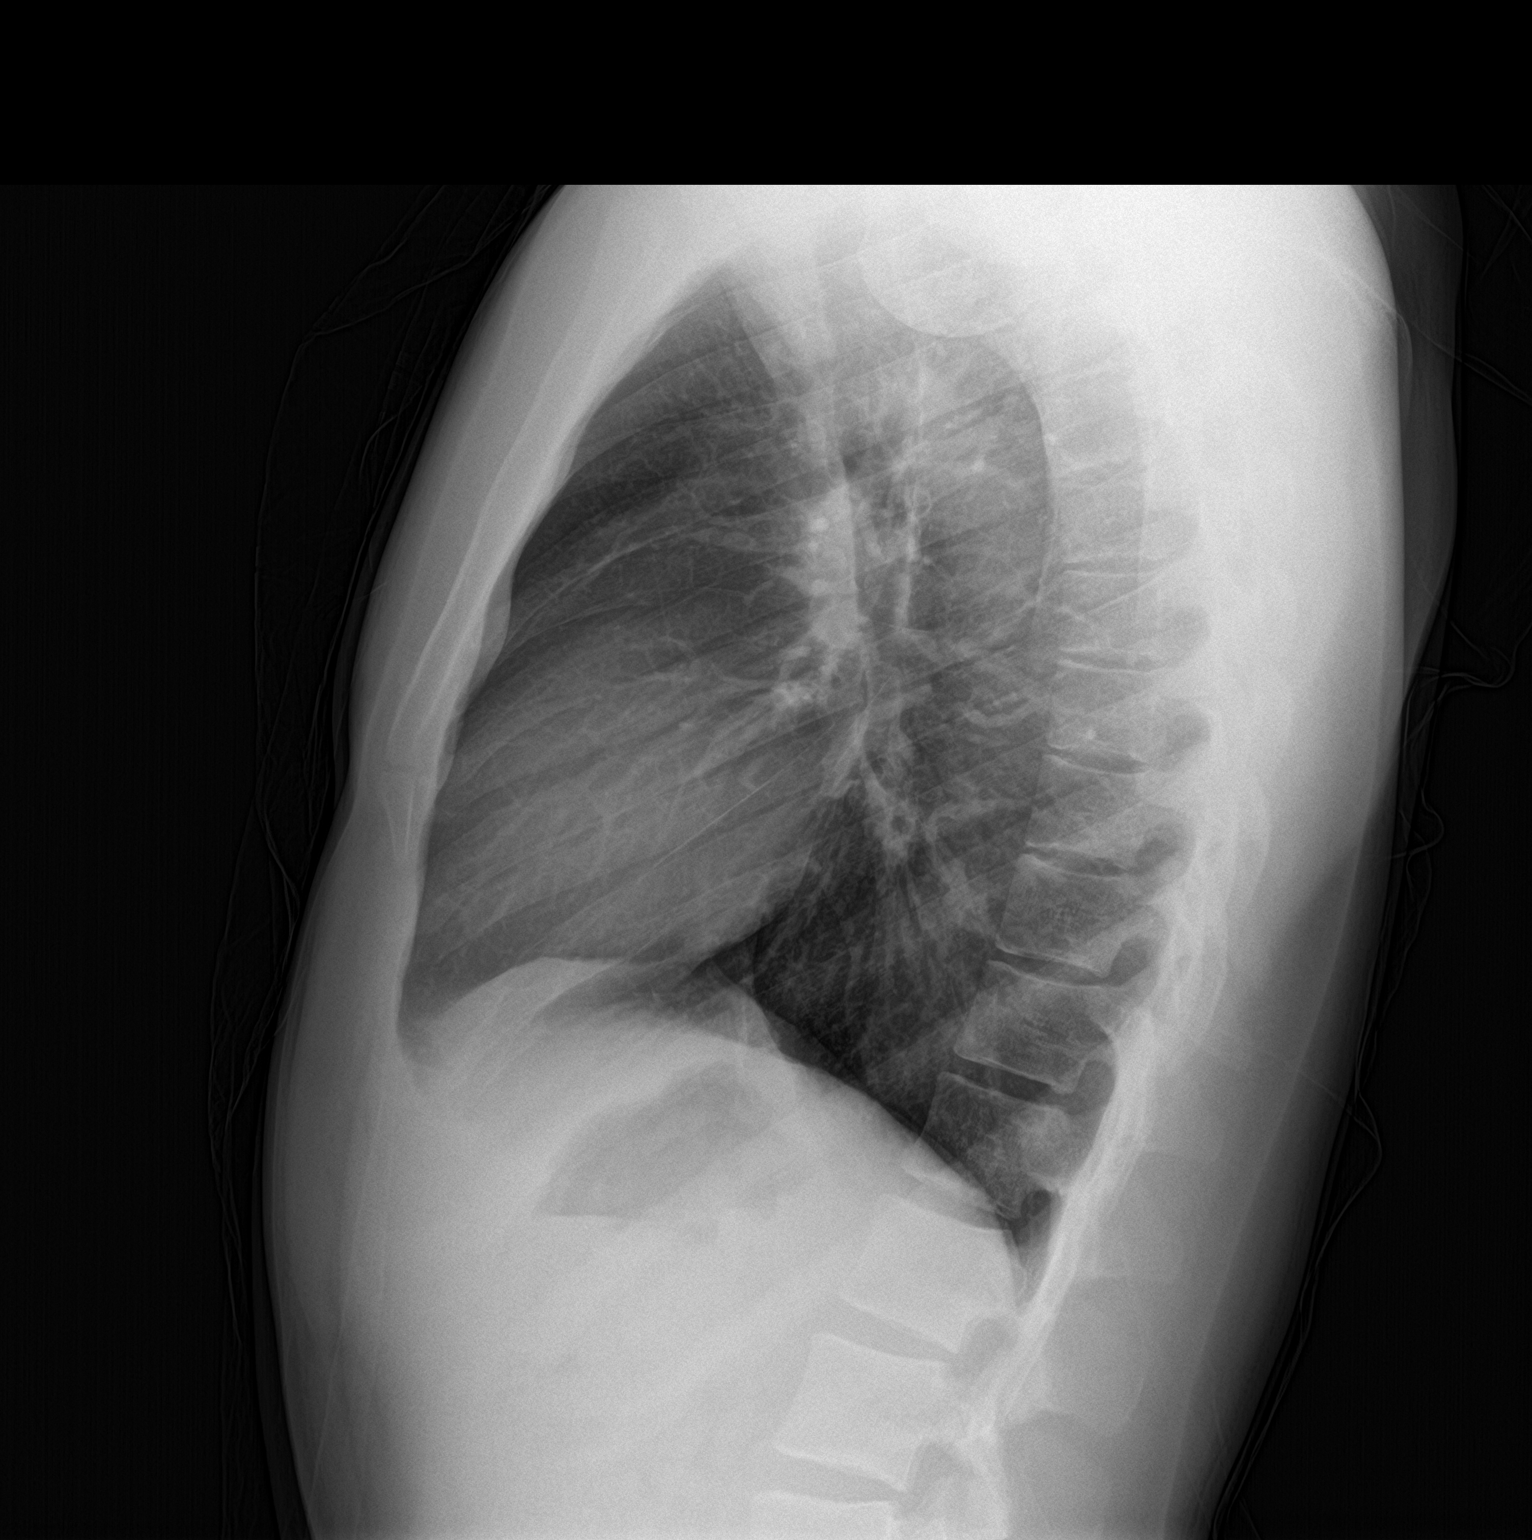

[2 of 2 positions shown; findings below may reference images not displayed]

FINDINGS: The heart size and mediastinal contours are within normal limits.
Both lungs are clear. The visualized skeletal structures are
unremarkable.
IMPRESSION: No active cardiopulmonary disease.

## 2022-07-10 ENCOUNTER — Emergency Department
Admission: EM | Admit: 2022-07-10 | Discharge: 2022-07-10 | Disposition: A | Discharge status: Home / Self Care | Payer: Managed Care, Other (non HMO)

## 2022-07-10 DIAGNOSIS — R1032 Left lower quadrant pain: Secondary | ICD-10-CM

## 2022-07-10 NOTE — Discharge Instructions (Signed)
Return if needed

## 2022-07-10 NOTE — ED Provider Notes (Signed)
[facility]    CSN: [id number] Arrival date & time: 07/10/22  1406      History   Chief Complaint Chief Complaint  Patient presents with   Groin Pain    Left side groin pain. X3 days    HPI Michael Young is a 21 y.o. male.   HPI  Healthy 21 year old.  On no medications.  He states he likes to workout in the gym quite frequently.  During one of his workouts recently when he was doing a lot of ab work he developed pain in his left lower abdomen and groin region.  It lasted for couple days.  He mentioned it at work.  They told him that he needs a note to come back to work to make sure that he does not have a hernia.  He states he has never had a hernia, did not have any bulging, and he feels certain that it was a muscle strain.  No urinary complaints.   Past Medical History:  Diagnosis Date   Concussion with loss of consciousness 10/22/2013    There are no problems to display for this patient.   Past Surgical History:  Procedure Laterality Date   DISTAL BICEPS TENDON REPAIR Left 11/25/2020   Procedure: DISTAL TRICEPS TENDON REPAIR;  Surgeon: Bjorn Pippin, MD;  Location: [facility];  Service: Orthopedics;  Laterality: Left;   NO PAST SURGERIES         Home Medications    Prior to Admission medications   Not on File    Family History Family History  Problem Relation Age of Onset   Sudden death Neg Hx    Hypertension Neg Hx    Hyperlipidemia Neg Hx    Heart attack Neg Hx    Diabetes Neg Hx     Social History Social History   Tobacco Use   Smoking status: Never   Smokeless tobacco: Never  Vaping Use   Vaping Use: Never used  Substance Use Topics   Alcohol use: No    Alcohol/week: 0.0 standard drinks of alcohol   Drug use: No     Allergies   Patient has no known allergies.   Review of Systems Review of Systems See HPI  Physical Exam Triage Vital Signs ED Triage Vitals  Enc Vitals Group     BP 07/10/22 1421 126/82      Pulse Rate 07/10/22 1421 94     Resp 07/10/22 1421 20     Temp 07/10/22 1421 98.7 F (37.1 C)     Temp Source 07/10/22 1421 Oral     SpO2 07/10/22 1421 96 %     Weight 07/10/22 1420 160 lb (72.6 kg)     Height 07/10/22 1420 5\' 10"  (1.778 m)     Head Circumference --      Peak Flow --      Pain Score 07/10/22 1420 0     Pain Loc --      Pain Edu? --      Excl. in GC? --    No data found.  Updated Vital Signs BP 126/82 (BP Location: Left Arm)   Pulse 94   Temp 98.7 F (37.1 C) (Oral)   Resp 20   Ht 5\' 10"  (1.778 m)   Wt 72.6 kg   SpO2 96%   BMI 22.96 kg/m      Physical Exam Constitutional:      General: He is not in acute distress.  Appearance: He is well-developed.  HENT:     Head: Normocephalic and atraumatic.  Eyes:     Conjunctiva/sclera: Conjunctivae normal.     Pupils: Pupils are equal, round, and reactive to light.  Cardiovascular:     Rate and Rhythm: Normal rate.  Pulmonary:     Effort: Pulmonary effort is normal. No respiratory distress.  Abdominal:     General: There is no distension.     Palpations: Abdomen is soft. There is no mass.     Tenderness: There is no abdominal tenderness.     Hernia: No hernia is present.  Musculoskeletal:        General: Normal range of motion.     Cervical back: Normal range of motion.  Skin:    General: Skin is warm and dry.  Neurological:     Mental Status: He is alert.      UC Treatments / Results  Labs (all labs ordered are listed, but only abnormal results are displayed) Labs Reviewed - No data to display  EKG   Radiology No results found.  Procedures Procedures (including critical care time)  Medications Ordered in UC Medications - No data to display  Initial Impression / Assessment and Plan / UC Course  I have reviewed the triage vital signs and the nursing notes.  Pertinent labs & imaging results that were available during my care of the patient were reviewed by me and considered in  my medical decision making (see chart for details).     Final Clinical Impressions(s) / UC Diagnoses   Final diagnoses:  Groin pain, left     Discharge Instructions      Return if needed   ED Prescriptions   None    PDMP not reviewed this encounter.   Eustace Moore, MD 07/10/22 1440

## 2022-07-10 NOTE — ED Triage Notes (Signed)
Pt states that he had some groin pain x3 days ago.  Pt states that the pain has since resolved. Pt states that he does work out a lot and this has happened before.   Pt states that he is required to have a note for work.

## 2022-12-03 ENCOUNTER — Encounter: Payer: Self-pay | Admitting: Emergency Medicine

## 2022-12-03 ENCOUNTER — Ambulatory Visit: Payer: Managed Care, Other (non HMO)

## 2022-12-03 ENCOUNTER — Other Ambulatory Visit: Payer: Self-pay

## 2022-12-03 ENCOUNTER — Ambulatory Visit
Admission: EM | Admit: 2022-12-03 | Discharge: 2022-12-03 | Disposition: A | Discharge status: Home / Self Care | Payer: Managed Care, Other (non HMO) | Attending: Family Medicine | Admitting: Family Medicine

## 2022-12-03 DIAGNOSIS — S59901A Unspecified injury of right elbow, initial encounter: Secondary | ICD-10-CM

## 2022-12-03 DIAGNOSIS — M25521 Pain in right elbow: Secondary | ICD-10-CM

## 2022-12-03 NOTE — ED Provider Notes (Signed)
[facility]    CSN: [id number] Arrival date & time: 12/03/22  1717      History   Chief Complaint Chief Complaint  Patient presents with   Elbow Injury    HPI Michael Young is a 21 y.o. male.   HPI 21 year old male presents with right elbow injury playing football throwing football felt like it locked up.  Patient reports elbow pain began on November 20 now getting worse.  Past Medical History:  Diagnosis Date   Concussion with loss of consciousness 10/22/2013    There are no problems to display for this patient.   Past Surgical History:  Procedure Laterality Date   DISTAL BICEPS TENDON REPAIR Left 11/25/2020   Procedure: DISTAL TRICEPS TENDON REPAIR;  Surgeon: Bjorn Pippin, MD;  Location: [facility];  Service: Orthopedics;  Laterality: Left;   NO PAST SURGERIES         Home Medications    Prior to Admission medications   Not on File    Family History Family History  Problem Relation Age of Onset   Sudden death Neg Hx    Hypertension Neg Hx    Hyperlipidemia Neg Hx    Heart attack Neg Hx    Diabetes Neg Hx     Social History Social History   Tobacco Use   Smoking status: Never   Smokeless tobacco: Never  Vaping Use   Vaping Use: Never used  Substance Use Topics   Alcohol use: No    Alcohol/week: 0.0 standard drinks of alcohol   Drug use: No     Allergies   Patient has no known allergies.   Review of Systems Review of Systems  Musculoskeletal:        Right elbow pain since 11/12/2022  All other systems reviewed and are negative.    Physical Exam Triage Vital Signs ED Triage Vitals  Enc Vitals Group     BP 12/03/22 1758 116/62     Pulse Rate 12/03/22 1758 65     Resp 12/03/22 1758 16     Temp 12/03/22 1758 98.8 F (37.1 C)     Temp Source 12/03/22 1758 Oral     SpO2 12/03/22 1758 100 %     Weight 12/03/22 1759 154 lb (69.9 kg)     Height 12/03/22 1759 5\' 10"  (1.778 m)     Head Circumference --       Peak Flow --      Pain Score 12/03/22 1758 8     Pain Loc --      Pain Edu? --      Excl. in GC? --    No data found.  Updated Vital Signs BP 116/62   Pulse 65   Temp 98.8 F (37.1 C) (Oral)   Resp 16   Ht 5\' 10"  (1.778 m)   Wt 154 lb (69.9 kg)   SpO2 100%   BMI 22.10 kg/m      Physical Exam Vitals and nursing note reviewed.  Constitutional:      General: He is not in acute distress.    Appearance: Normal appearance. He is normal weight. He is not ill-appearing.  HENT:     Head: Normocephalic and atraumatic.     Mouth/Throat:     Mouth: Mucous membranes are moist.     Pharynx: Oropharynx is clear.  Eyes:     Extraocular Movements: Extraocular movements intact.     Conjunctiva/sclera: Conjunctivae normal.     Pupils:  Pupils are equal, round, and reactive to light.  Cardiovascular:     Rate and Rhythm: Normal rate and regular rhythm.     Pulses: Normal pulses.     Heart sounds: Normal heart sounds.  Pulmonary:     Effort: Pulmonary effort is normal.     Breath sounds: Normal breath sounds. No wheezing, rhonchi or rales.  Musculoskeletal:        General: Normal range of motion.     Cervical back: Normal range of motion and neck supple.     Comments: Right elbow: mildly TTP over olecranon  Skin:    General: Skin is warm and dry.  Neurological:     General: No focal deficit present.     Mental Status: He is alert and oriented to person, place, and time. Mental status is at baseline.      UC Treatments / Results  Labs (all labs ordered are listed, but only abnormal results are displayed) Labs Reviewed - No data to display  EKG   Radiology DG Elbow Complete Right  Result Date: 12/03/2022 CLINICAL DATA:  Football injury 11/12/2022 EXAM: RIGHT ELBOW - COMPLETE 3+ VIEW COMPARISON:  02/21/2015 FINDINGS: Elbow joint effusion noted with abnormal 1.0 by 0.6 cm ossific structure anterior to the distal humeral metaphysis suspicious for a free osteochondral  fragment. This is relatively well corticated and may be chronic. Donor site uncertain although there is some mild heterogeneity in the capitellum. IMPRESSION: 1. 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 2. Elbow joint effusion. 3. Mild heterogeneity of the capitellum. 4. Consider MRI for further characterization of the above findings. This could be performed on an outpatient basis. Electronically Signed   By: Gaylyn Rong M.D.   On: 12/03/2022 18:45    Procedures Procedures (including critical care time)  Medications Ordered in UC Medications - No data to display  Initial Impression / Assessment and Plan / UC Course  I have reviewed the triage vital signs and the nursing notes.  Pertinent labs & imaging results that were available during my care of the patient were reviewed by me and considered in my medical decision making (see chart for details).     MDM: 1.  Right elbow pain-right elbow x-ray revealed above. Advised patient of right elbow x-ray results with hardcopy provided to patient.  Advised patient to please follow-up with [facility] orthopedic provider for further evaluation.  Contact information is below. Final Clinical Impressions(s) / UC Diagnoses   Final diagnoses:  Right elbow pain     Discharge Instructions      Advised patient of right elbow x-ray results with hardcopy provided to patient.  Advised patient to please follow-up with [facility] orthopedic provider for further evaluation.  Contact information is below.     ED Prescriptions   None    PDMP not reviewed this encounter.   Trevor Iha, FNP 12/03/22 1859

## 2022-12-03 NOTE — ED Triage Notes (Signed)
Rt elbow injury playing football, throwing football felt it lock up, started Nov 20, getting worse.

## 2022-12-03 NOTE — Discharge Instructions (Addendum)
Advised patient of right elbow x-ray results with hardcopy provided to patient.  Advised patient to please follow-up with [facility] orthopedic provider for further evaluation.  Contact information is below.

## 2022-12-04 ENCOUNTER — Encounter: Payer: Self-pay | Admitting: Family Medicine

## 2022-12-04 ENCOUNTER — Ambulatory Visit (INDEPENDENT_AMBULATORY_CARE_PROVIDER_SITE_OTHER): Payer: Managed Care, Other (non HMO) | Admitting: Family Medicine

## 2022-12-04 VITALS — BP 118/74 | Ht 70.0 in | Wt 160.0 lb

## 2022-12-04 DIAGNOSIS — M21829 Other specified acquired deformities of unspecified upper arm: Secondary | ICD-10-CM | POA: Diagnosis not present

## 2022-12-04 DIAGNOSIS — M19029 Primary osteoarthritis, unspecified elbow: Secondary | ICD-10-CM | POA: Insufficient documentation

## 2022-12-04 NOTE — Patient Instructions (Signed)
Nice to meet you Please use ice as needed  We'll get the MRI at [facility]   Please send me a message in MyChart with any questions or updates.  We'll setup a virtual visit once the MRI is resulted.   --Dr. Jordan Likes

## 2022-12-04 NOTE — Progress Notes (Signed)
  Michael Young - 21 y.o. male MRN [id number]  Date of birth: 07/11/2001  SUBJECTIVE:  Including CC & ROS.  No chief complaint on file.   Michael Young is a 21 y.o. male that is presenting with acute right elbow pain.  He is having pain upon flexion of the elbow.  No history of surgery.  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  His arm will get stuck in a certain position intermittently.  Review of the urgent care note from 12/11 shows he was counseled on supportive care. Independent review of the right elbow x-ray from 12/11 shows a osteochondral defect of the distal humeral metaphysis and an effusion.  Review of Systems See HPI   HISTORY: Past Medical, Surgical, Social, and Family History Reviewed & Updated per EMR.   Pertinent Historical Findings include:  Past Medical History:  Diagnosis Date   Concussion with loss of consciousness 10/22/2013    Past Surgical History:  Procedure Laterality Date   DISTAL BICEPS TENDON REPAIR Left 11/25/2020   Procedure: DISTAL TRICEPS TENDON REPAIR;  Surgeon: Bjorn Pippin, MD;  Location: [facility];  Service: Orthopedics;  Laterality: Left;   NO PAST SURGERIES       PHYSICAL EXAM:  VS: BP 118/74   Ht 5\' 10"  (1.778 m)   Wt 160 lb (72.6 kg)   BMI 22.96 kg/m  Physical Exam Gen: NAD, alert, cooperative with exam, well-appearing MSK:  Right elbow: Limited flexion and extension. Tenderness to palpation over the joint space. Effusion present. Limited supination and pronation. Weakness to resistance. Neurovascularly intact       ASSESSMENT & PLAN:   Osteochondral defect of lateral humeral condyle Acutely occurring.  Presenting with pain and mechanical symptoms.  Recent x-ray was showing a possible osteochondral defect.  Potential from an old pitching injury in light of new mechanical symptoms. -Counseled on home exercise therapy and supportive care. -MRI of the right elbow to evaluate for  osteochondral defect and for presurgical planning.

## 2022-12-04 NOTE — Assessment & Plan Note (Signed)
Acutely occurring.  Presenting with pain and mechanical symptoms.  Recent x-ray was showing a possible osteochondral defect.  Potential from an old pitching injury in light of new mechanical symptoms. -Counseled on home exercise therapy and supportive care. -MRI of the right elbow to evaluate for osteochondral defect and for presurgical planning.

## 2022-12-15 ENCOUNTER — Ambulatory Visit: Payer: Managed Care, Other (non HMO)

## 2022-12-15 DIAGNOSIS — M21829 Other specified acquired deformities of unspecified upper arm: Secondary | ICD-10-CM

## 2022-12-15 DIAGNOSIS — M25521 Pain in right elbow: Secondary | ICD-10-CM | POA: Diagnosis not present

## 2022-12-26 ENCOUNTER — Telehealth (INDEPENDENT_AMBULATORY_CARE_PROVIDER_SITE_OTHER): Payer: Managed Care, Other (non HMO) | Admitting: Family Medicine

## 2022-12-26 ENCOUNTER — Encounter: Payer: Self-pay | Admitting: Family Medicine

## 2022-12-26 VITALS — Ht 70.0 in | Wt 160.0 lb

## 2022-12-26 DIAGNOSIS — M19029 Primary osteoarthritis, unspecified elbow: Secondary | ICD-10-CM | POA: Diagnosis not present

## 2022-12-26 NOTE — Assessment & Plan Note (Signed)
He continues to have mechanical symptoms of the elbow.  MRI was revealing for degenerative changes and effusion -Counseled on home exercise therapy and supportive care. -Referral to orthopedic surgeon

## 2022-12-26 NOTE — Progress Notes (Signed)
Virtual Visit via Video Note  I connected with Michael Young on 12/26/22 at  1:00 PM EST by a video enabled telemedicine application and verified that I am speaking with the correct person using two identifiers.  Location: Patient: home Provider: office   I discussed the limitations of evaluation and management by telemedicine and the availability of in person appointments. The patient expressed understanding and agreed to proceed.  History of Present Illness:  Mr. Michael Young is a 22 yo M that is following up after the MRI of his right elbow. Continues to have limitations of his right elbow. MRi is revealing for partial thickness cartilage loss of the elbow joint.    Observations/Objective:   Assessment and Plan:  Right elbow arthritis:  He continues to have mechanical symptoms of the elbow.  MRI was revealing for degenerative changes and effusion -Counseled on home exercise therapy and supportive care. -Referral to orthopedic surgeon  Follow Up Instructions:    I discussed the assessment and treatment plan with the patient. The patient was provided an opportunity to ask questions and all were answered. The patient agreed with the plan and demonstrated an understanding of the instructions.   The patient was advised to call back or seek an in-person evaluation if the symptoms worsen or if the condition fails to improve as anticipated.    Clearance Coots, MD

## 2022-12-31 ENCOUNTER — Encounter: Payer: Self-pay | Admitting: Family Medicine

## 2023-04-08 ENCOUNTER — Encounter: Payer: Self-pay | Admitting: *Deleted

## 2023-08-01 ENCOUNTER — Ambulatory Visit
Admission: EM | Admit: 2023-08-01 | Discharge: 2023-08-01 | Disposition: A | Discharge status: Home / Self Care | Payer: Managed Care, Other (non HMO) | Attending: Internal Medicine | Admitting: Internal Medicine

## 2023-08-01 DIAGNOSIS — M940 Chondrocostal junction syndrome [Tietze]: Secondary | ICD-10-CM

## 2023-08-01 MED ORDER — NAPROXEN 375 MG PO TABS: 375.0000 mg | ORAL_TABLET | Freq: Two times a day (BID) | ORAL | 0 refills | Status: AC

## 2023-08-01 NOTE — ED Triage Notes (Signed)
Pt reports right sided rib cage pain x 2 days. Pain is worse when laughing or taking a deep breath. Pt has not taken any meds for complaints.

## 2023-08-01 NOTE — ED Provider Notes (Signed)
[facility]    CSN: [id number] Arrival date & time: 08/01/23  1431      History   Chief Complaint Chief Complaint  Patient presents with   Rib cage pain    HPI Michael Young is a 22 y.o. male.   Patient presents to urgent care for evaluation of right sided anterior ribcage pain that started 2 days ago without known injuries or trauma to the area of pain. Pain is worse with laughing, exhaling, and movement. No recent changes in physical activity or weight lifting. No cough, fever/chills, or other viral URI symptoms. No rash or shortness of breath. Intermittently smokes marijuana, no other drug use. Has not attempted treatment of symptoms PTA.      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 Authorizing Provider  naproxen (NAPROSYN) 375 MG tablet Take 1 tablet (375 mg total) by mouth 2 (two) times daily. 08/01/23  Yes Terrisha Lopata, Donavan Burnet, FNP    Family History Family History  Problem Relation Age of Onset   Sudden death Neg Hx    Hypertension Neg Hx    Hyperlipidemia Neg Hx    Heart attack Neg Hx    Diabetes Neg Hx     Social History Social History   Tobacco Use   Smoking status: Never   Smokeless tobacco: Never  Vaping Use   Vaping status: Some Days  Substance Use Topics   Alcohol use: No   Drug use: Yes    Frequency: 3.0 times per week    Types: Marijuana     Allergies   Patient has no known allergies.   Review of Systems Review of  Systems Per HPI  Physical Exam Triage Vital Signs ED Triage Vitals  Encounter Vitals Group     BP 08/01/23 1454 108/64     Systolic BP Percentile --      Diastolic BP Percentile --      Pulse Rate 08/01/23 1454 81     Resp 08/01/23 1454 16     Temp 08/01/23 1454 98.3 F (36.8 C)     Temp Source 08/01/23 1454 Oral     SpO2 08/01/23 1454 96 %     Weight --      Height --      Head Circumference --      Peak Flow --      Pain Score 08/01/23 1458 7     Pain Loc --      Pain Education --      Exclude from Growth Chart --    No data found.  Updated Vital Signs BP 108/64 (BP Location: Right Arm)   Pulse 81   Temp 98.3 F (36.8 C) (Oral)   Resp 16   SpO2 96%   Visual Acuity Right Eye Distance:   Left Eye Distance:   Bilateral Distance:    Right Eye Near:   Left Eye Near:    Bilateral  Near:     Physical Exam Vitals and nursing note reviewed.  Constitutional:      Appearance: He is not ill-appearing or toxic-appearing.  HENT:     Head: Normocephalic and atraumatic.     Right Ear: Hearing and external ear normal.     Left Ear: Hearing and external ear normal.     Nose: Nose normal.     Mouth/Throat:     Lips: Pink.  Eyes:     General: Lids are normal. Vision grossly intact. Gaze aligned appropriately.     Extraocular Movements: Extraocular movements intact.     Conjunctiva/sclera: Conjunctivae normal.  Cardiovascular:     Rate and Rhythm: Normal rate and regular rhythm.     Heart sounds: Normal heart sounds, S1 normal and S2 normal.  Pulmonary:     Effort: Pulmonary effort is normal. No respiratory distress.     Breath sounds: Normal breath sounds and air entry.  Chest:     Chest wall: Tenderness present. No mass, lacerations, deformity, swelling, crepitus or edema. There is no dullness to percussion.  Breasts:    Breasts are symmetrical.     Right: Normal.     Left: Normal.    Musculoskeletal:     Cervical back: Neck supple.  Skin:    General: Skin is  warm and dry.     Capillary Refill: Capillary refill takes less than 2 seconds.     Findings: No rash.  Neurological:     General: No focal deficit present.     Mental Status: He is alert and oriented to person, place, and time. Mental status is at baseline.     Cranial Nerves: No dysarthria or facial asymmetry.  Psychiatric:        Mood and Affect: Mood normal.        Speech: Speech normal.        Behavior: Behavior normal.        Thought Content: Thought content normal.        Judgment: Judgment normal.      UC Treatments / Results  Labs (all labs ordered are listed, but only abnormal results are displayed) Labs Reviewed - No data to display  EKG   Radiology No results found.  Procedures Procedures (including critical care time)  Medications Ordered in UC Medications - No data to display  Initial Impression / Assessment and Plan / UC Course  I have reviewed the triage vital signs and the nursing notes.  Pertinent labs & imaging results that were available during my care of the patient were reviewed by me and considered in my medical decision making (see chart for details).   1. Costochondritis Evaluation suggests costochondritis etiology. Low suspicion for cardiopulmonary/musculoskeletal abnormality, therefore deferred imaging of the chest wall. Will manage this with naproxen BID as needed for pain and inflammation.  Counseled patient on potential for adverse effects with medications prescribed/recommended today, strict ER and return-to-clinic precautions discussed, patient verbalized understanding.    Final Clinical Impressions(s) / UC Diagnoses   Final diagnoses:  Costochondritis     Discharge Instructions      Take naproxen every 12 hours as needed for ribcage pain.  Warm compresses may help further with pain.  If you develop any new or worsening symptoms or if your symptoms do not start to improve, please return here or follow-up with your primary care  provider. If your symptoms are severe, please go to the emergency room.     ED Prescriptions  Medication Sig Dispense Auth. Provider   naproxen (NAPROSYN) 375 MG tablet Take 1 tablet (375 mg total) by mouth 2 (two) times daily. 20 tablet Carlisle Beers, FNP      PDMP not reviewed this encounter.   Carlisle Beers, [place] 08/01/23 [phone]

## 2023-08-01 NOTE — Discharge Instructions (Signed)
Take naproxen every 12 hours as needed for ribcage pain.  Warm compresses may help further with pain.  If you develop any new or worsening symptoms or if your symptoms do not start to improve, please return here or follow-up with your primary care provider. If your symptoms are severe, please go to the emergency room.

## 2024-05-11 ENCOUNTER — Ambulatory Visit
Admission: EM | Admit: 2024-05-11 | Discharge: 2024-05-11 | Disposition: A | Discharge status: Home / Self Care | Attending: Family Medicine | Admitting: Family Medicine

## 2024-05-11 DIAGNOSIS — A530 Latent syphilis, unspecified as early or late: Secondary | ICD-10-CM | POA: Diagnosis not present

## 2024-05-11 DIAGNOSIS — Z114 Encounter for screening for human immunodeficiency virus [HIV]: Secondary | ICD-10-CM | POA: Diagnosis not present

## 2024-05-11 NOTE — ED Triage Notes (Signed)
 Patient present to the office after receiving a letter from [facility] about positive syphilis test results. Patient does not have any symptoms.

## 2024-05-11 NOTE — ED Provider Notes (Signed)
 [facility]  Note:  This document was prepared using Conservation officer, historic buildings and may include unintentional dictation errors.  MRN: [id number] DOB: [birth year]  Subjective:   Michael Young is a 23 y.o. male presenting for testing for syphilis. Patient reports that he received a letter from a [facility] where he donated about a positive syphilis test.  Was advised to be checked for this.  Denies dysuria, hematuria, urinary frequency, penile discharge, penile swelling, testicular pain, testicular swelling, anal pain, groin pain, rashes, joint pains.  Does not want to be screened for gonorrhea or chlamydia, trichomonas.  No current facility-administered medications for this encounter.  Current Outpatient Medications:    naproxen  (NAPROSYN ) 375 MG tablet, Take 1 tablet (375 mg total) by mouth 2 (two) times daily., Disp: 20 tablet, Rfl: 0   No Known Allergies  Past Medical History:  Diagnosis Date   Concussion with loss of consciousness 10/22/2013     Past Surgical History:  Procedure Laterality Date   DISTAL BICEPS TENDON REPAIR Left 11/25/2020   Procedure: DISTAL TRICEPS TENDON REPAIR;  Surgeon: Micheline Ahr, MD;  Location: [facility];  Service: Orthopedics;  Laterality: Left;   NO PAST SURGERIES      Family History  Problem Relation Age of Onset   Sudden death Neg Hx    Hypertension Neg Hx    Hyperlipidemia Neg Hx    Heart attack Neg Hx    Diabetes Neg Hx     Social History   Tobacco Use   Smoking status: Never   Smokeless tobacco: Never  Vaping Use   Vaping status: Some Days  Substance Use Topics   Alcohol use: No   Drug use: Yes    Frequency: 3.0 times per week    Types: Marijuana    ROS   Objective:   Vitals: BP 112/86 (BP Location: Left Arm)   Pulse 86   Temp 98.6 F (37 C) (Oral)   Resp 18   SpO2 98%   Physical Exam Constitutional:      General: He is not in acute distress.    Appearance: Normal  appearance. He is well-developed and normal weight. He is not ill-appearing, toxic-appearing or diaphoretic.  HENT:     Head: Normocephalic and atraumatic.     Right Ear: External ear normal.     Left Ear: External ear normal.     Nose: Nose normal.     Mouth/Throat:     Pharynx: Oropharynx is clear.  Eyes:     General: No scleral icterus.       Right eye: No discharge.        Left eye: No discharge.     Extraocular Movements: Extraocular movements intact.  Cardiovascular:     Rate and Rhythm: Normal rate.  Pulmonary:     Effort: Pulmonary effort is normal.  Musculoskeletal:     Cervical back: Normal range of motion.  Neurological:     Mental Status: He is alert and oriented to person, place, and time.  Psychiatric:        Mood and Affect: Mood normal.        Behavior: Behavior normal.        Thought Content: Thought content normal.        Judgment: Judgment normal.     Assessment and Plan :   PDMP not reviewed this encounter.  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 Encounter for screening for HIV  Will screen for syphilis and HIV.  Patient is agreeable to treat based off of results.  He declined other STI testing.   Adolph Hoop, [place] 05/11/24 1610

## 2024-05-11 NOTE — Discharge Instructions (Signed)
 We will let you know about your blood test results tomorrow for HIV and syphilis and treat you based off of any positive results.

## 2024-05-12 LAB — HIV ANTIBODY (ROUTINE TESTING W REFLEX): HIV Screen 4th Generation wRfx: NONREACTIVE

## 2024-05-12 LAB — RPR: RPR Ser Ql: NONREACTIVE
